# Patient Record
Sex: Male | Born: 1970 | Hispanic: No | Marital: Married | Smoking: Never smoker
Health system: Southern US, Community
[De-identification: ages and names within clinical notes are randomized; demographics above are authoritative.]

## PROBLEM LIST (undated history)

## (undated) DIAGNOSIS — I8390 Asymptomatic varicose veins of unspecified lower extremity: Secondary | ICD-10-CM

## (undated) HISTORY — PX: APPENDECTOMY: SHX54

## (undated) HISTORY — DX: Asymptomatic varicose veins of unspecified lower extremity: I83.90

---

## 2003-12-05 ENCOUNTER — Encounter: Admission: RE | Admit: 2003-12-05 | Discharge: 2003-12-05 | Payer: Self-pay | Admitting: Family Medicine

## 2004-06-29 ENCOUNTER — Inpatient Hospital Stay (HOSPITAL_COMMUNITY): Admission: EM | Admit: 2004-06-29 | Discharge: 2004-06-30 | Payer: Self-pay | Admitting: Emergency Medicine

## 2004-06-29 ENCOUNTER — Encounter (INDEPENDENT_AMBULATORY_CARE_PROVIDER_SITE_OTHER): Payer: Self-pay | Admitting: Specialist

## 2008-08-14 ENCOUNTER — Emergency Department (HOSPITAL_BASED_OUTPATIENT_CLINIC_OR_DEPARTMENT_OTHER): Admission: EM | Admit: 2008-08-14 | Discharge: 2008-08-14 | Payer: Self-pay | Admitting: Emergency Medicine

## 2008-08-14 ENCOUNTER — Ambulatory Visit: Payer: Self-pay | Admitting: Diagnostic Radiology

## 2008-12-11 ENCOUNTER — Encounter: Admission: RE | Admit: 2008-12-11 | Discharge: 2008-12-11 | Payer: Self-pay | Admitting: Unknown Physician Specialty

## 2010-08-09 NOTE — H&P (Signed)
NAMEJONLUKE, COBBINS NO.:  [id number]   MEDICAL RECORD NO.:  [id number]          PATIENT TYPE:  INP   LOCATION:  0104                         FACILITY:  [facility]   PHYSICIAN:  Anselm Pancoast. Weatherly, M.D.DATE OF BIRTH:  09/10/1970   DATE OF ADMISSION:  06/29/2004  DATE OF DISCHARGE:                                HISTORY & PHYSICAL   CHIEF COMPLAINT:  Abdominal pain.   HISTORY:  Matthew Macdonald is a 40 year old quite muscular male who presented to  the emergency room this afternoon after being seen in Urgent Care by Dr.  Cleta Alberts with the following history; said he felt okay yesterday but then during  the night was kind of bloated, cramping, bowels felt like he needed to have  a bowel movement but could not then in the morning started having more pain  and the pain kind of shifted to the right lower quadrant of the abdomen.  He  was seen by Dr. Cleta Alberts, they did a KUB that showed a kind of question of a  little calcification in the right lower quadrant, urinalysis was negative,  white count was elevated at approximately 16,000 and the patient was  definitely tender in the right lower quadrant of the abdomen.  Dr. Cleta Alberts  called me and I asked to see him in the emergency room and on examination he  definitely complains of tenderness predominantly in the right lower  quadrant, he is definitely more tender in the lower abdomen than the right  but he is definitely tender on the right and he is quite muscular.  He is  not tender on the left side and on review of the KUB the little area that  they question whether this could be possibly a kidney stone to me looks like  possibly it could be a fecalith but I think it is unlikely a kidney stone  and I talked with the radiologist, since he is not here and is at [facility] we actually got a repeat KUB that was read as unremarkable and the  little area that is on the repeat KUB comparing with the one from the Urgent  Care you can see that  the little area of calcification is a little  __________ actually within the sacroiliac joint.  I discussed with the  patient that clinically with this kind of epigastric pain then the pain  shifted to the right lower quadrant, mild temperature elevation and a marked  leukocytosis that clinically he has got acute appendicitis.  The patient had  a similar episode of pain about 6 months ago that never progressed with pain  this intense and he saw his doctor and they sort of thought it was a  gastroenteritis or whatever and suggested he take Pepcid.  He took Pepcid  this morning and it made no improvement in his pain.  He has got no past  history of any significant illnesses, he works as an Paramedic type work, he is  accompanied by several family members and he has had no previous surgery.  This morning he ate breakfast about 9:30  and he has been n.p.o. since then.   PHYSICAL EXAMINATION:  GENERAL:  He is a pleasant quite muscular young male  not really fat but weighs 208 pounds.  Temperature was 97, pulse 86,  respirations 16, blood pressure is 123/78.  EYES, EARS, NOSE AND THROAT:  Unremarkable, well hydrated, good breath  sounds.  CARDIAC:  Normal sinus rhythm.  ABDOMEN:  He has got kind of a slightly bloated abdomen.  He is definitely  tender on the right, predominantly right lower quadrant with muscle guarded  and rebound.  He is not tender on the left side.  He is slightly tender in  the right upper quadrant but it gets certainly more as you go lower.  RECTAL:  I did not do a rectal exam; it had been done at Urgent Care and was  described as unremarkable.  CNS:  Physiologic.  SKIN:  Unremarkable.   ADMISSION IMPRESSION:  Acute appendicitis.   PLAN:  We will plan an open appendectomy with general anesthesia.  He was  given 3 g of Unasyn and permission obtained for surgery.      WJW/MEDQ  D:  06/29/2004  T:  06/29/2004  Job:  [id number]

## 2010-08-09 NOTE — Op Note (Signed)
Matthew Macdonald, Matthew Macdonald NO.:  [id number]   MEDICAL RECORD NO.:  [id number]          PATIENT TYPE:  INP   LOCATION:  0104                         FACILITY:  [facility]   PHYSICIAN:  Anselm Pancoast. Weatherly, M.D.DATE OF BIRTH:  12/16/1970   DATE OF PROCEDURE:  06/29/2004  DATE OF DISCHARGE:                                 OPERATIVE REPORT   PREOPERATIVE DIAGNOSIS:  Acute appendicitis.   POSTOPERATIVE DIAGNOSIS:  Acute retrocecal appendicitis, nonruptured.   PROCEDURE:  Open appendectomy.   ANESTHESIA:  General.   SURGEON:  Anselm Pancoast. Zachery Dakins, M.D.   ASSISTANT:  Nurse.   INDICATIONS FOR PROCEDURE:  Matthew Macdonald is a 40 year old male who presented  to the emergency room with an approximately 24-hour history of progressive  pain, epigastric with a shift to the right lower quadrant.  He had a left  shift and an elevated white count.  A urinalysis was unremarkable.  A KUB at  the Urgent Care we first thought when seen was thought possibly to have a  calcification in the right lower quadrant.  This was repeated here and was  thought to be a little bony area in the pelvis and not a stone or fecalith.  The patient's tenderness is definitely right lower quadrant with muscle  guarding, and I recommended that we proceed on with an appendectomy.  The  patient had a similar episode of pain that subsided after about 24 hours  approximately six months ago.  He saw his doctor but no definite etiology  was ever determined.  He has not had a problem with diarrhea or kind of  chronic __________ abdominal pain.   DESCRIPTION OF PROCEDURE:  Preoperatively, he was given 3 g of Unasyn and  taken to the operating suite.  They had already placed him on PAS stockings.  No Foley was placed, and the abdomen was prepped with Betadine solution and  draped in a sterile manner.   A McBurney incision was made and the muscles split down through the skin,  Scarpa's fascia, external oblique.  The  external oblique was split in the  direction of its fibers.  He has a quite muscular rectus, and we opened just  lateral to this.  The little vessel right at the edge of the rectus was  divided, clamped, and ligated with 2-0 Vicryl.  The patient was somewhat  gaseous, and feeling down in the area you could feel kind of a thickening.  I could see his cecum and the terminal ileum, but I could not actually see  the appendix.  I enlarged the incision slightly so I could open up the  posterior peritoneal reflection.  The appendix was truly a retrocecal  appendix.  With mobilizing the cecum, I could then sort of peel this  inflamed appendix with a little short stubby appendix with the inflammation  in the distal two-thirds of the appendix, and the little mesentery vessels  were separated carefully and ligated with 2-0 Vicryl.  Then the base, which  was not inflamed, was crushed with a hemostat and ligated with 2-0 Vicryl.  The  appendix was removed.  A pursestring suture of 3-0 silk was placed, the  stump inverted, and the pursestring suture tied.  I placed an additional  stitch to kind of reinforce the closure, and then dropped the cecum, which I  never really had it elevated up on the skin level.  No evidence of any  bleeding.  Sponge count was correct.  I then closed the peritoneum and then  transversalis with a running 2-0 Vicryl.  The internal oblique was closed  with running 2-0 Vicryl and the external oblique with running 2-0 Vicryl.  Scarpa's fascia was closed with interrupted 4-0 Vicryl, 4-0 Vicryl two  subcuticular stitches, and 0.5-inch Steri-Strips and benzoin on the skin.   The patient tolerated the procedure nicely.  I am going to give him one  additional dose of antibiotics, and he should be able to tolerate a liquid  diet.  Whether he will go home tomorrow or Monday morning will be determined  on his clinical course.  The patient was awakened promptly and was resting  comfortably  in the recovery room after the completion of surgery.  Estimated  blood loss was minimal.  Sponge and needle counts were correct x2.      WJW/MEDQ  D:  06/29/2004  T:  06/30/2004  Job:  [id number]

## 2010-08-16 ENCOUNTER — Other Ambulatory Visit: Payer: Self-pay | Admitting: Internal Medicine

## 2010-08-16 ENCOUNTER — Ambulatory Visit
Admission: RE | Admit: 2010-08-16 | Discharge: 2010-08-16 | Disposition: A | Discharge status: Home / Self Care | Payer: BC Managed Care – PPO | Source: Ambulatory Visit | Attending: Internal Medicine | Admitting: Internal Medicine

## 2010-08-16 DIAGNOSIS — R52 Pain, unspecified: Secondary | ICD-10-CM

## 2010-11-24 ENCOUNTER — Emergency Department (HOSPITAL_COMMUNITY)
Admission: EM | Admit: 2010-11-24 | Discharge: 2010-11-24 | Disposition: A | Discharge status: Home / Self Care | Payer: BC Managed Care – PPO | Attending: Emergency Medicine | Admitting: Emergency Medicine

## 2010-11-24 ENCOUNTER — Emergency Department (HOSPITAL_COMMUNITY): Payer: BC Managed Care – PPO

## 2010-11-24 DIAGNOSIS — M25439 Effusion, unspecified wrist: Secondary | ICD-10-CM | POA: Insufficient documentation

## 2010-11-24 DIAGNOSIS — M25539 Pain in unspecified wrist: Secondary | ICD-10-CM | POA: Insufficient documentation

## 2010-11-24 DIAGNOSIS — M25569 Pain in unspecified knee: Secondary | ICD-10-CM | POA: Insufficient documentation

## 2010-11-24 DIAGNOSIS — S63509A Unspecified sprain of unspecified wrist, initial encounter: Secondary | ICD-10-CM | POA: Insufficient documentation

## 2010-11-24 DIAGNOSIS — IMO0002 Reserved for concepts with insufficient information to code with codable children: Secondary | ICD-10-CM | POA: Insufficient documentation

## 2010-11-24 DIAGNOSIS — S60219A Contusion of unspecified wrist, initial encounter: Secondary | ICD-10-CM | POA: Insufficient documentation

## 2011-07-03 ENCOUNTER — Ambulatory Visit
Admission: RE | Admit: 2011-07-03 | Discharge: 2011-07-03 | Disposition: A | Discharge status: Home / Self Care | Payer: BC Managed Care – PPO | Source: Ambulatory Visit | Attending: Internal Medicine | Admitting: Internal Medicine

## 2011-07-03 ENCOUNTER — Other Ambulatory Visit: Payer: Self-pay | Admitting: Internal Medicine

## 2011-07-03 DIAGNOSIS — R0602 Shortness of breath: Secondary | ICD-10-CM

## 2012-04-05 ENCOUNTER — Ambulatory Visit
Admission: RE | Admit: 2012-04-05 | Discharge: 2012-04-05 | Disposition: A | Discharge status: Home / Self Care | Payer: BC Managed Care – PPO | Source: Ambulatory Visit | Attending: Internal Medicine | Admitting: Internal Medicine

## 2012-04-05 ENCOUNTER — Other Ambulatory Visit: Payer: Self-pay | Admitting: Internal Medicine

## 2012-04-05 DIAGNOSIS — M25532 Pain in left wrist: Secondary | ICD-10-CM

## 2012-04-05 DIAGNOSIS — R0602 Shortness of breath: Secondary | ICD-10-CM

## 2012-12-27 ENCOUNTER — Other Ambulatory Visit: Payer: Self-pay | Admitting: Internal Medicine

## 2012-12-27 ENCOUNTER — Ambulatory Visit
Admission: RE | Admit: 2012-12-27 | Discharge: 2012-12-27 | Disposition: A | Discharge status: Home / Self Care | Payer: BC Managed Care – PPO | Source: Ambulatory Visit | Attending: Internal Medicine | Admitting: Internal Medicine

## 2012-12-27 DIAGNOSIS — R52 Pain, unspecified: Secondary | ICD-10-CM

## 2014-05-17 ENCOUNTER — Other Ambulatory Visit: Payer: Self-pay | Admitting: *Deleted

## 2014-05-17 ENCOUNTER — Encounter: Payer: Self-pay | Admitting: Vascular Surgery

## 2014-05-17 DIAGNOSIS — I8390 Asymptomatic varicose veins of unspecified lower extremity: Secondary | ICD-10-CM

## 2014-07-03 ENCOUNTER — Encounter: Payer: Self-pay | Admitting: Vascular Surgery

## 2014-07-04 ENCOUNTER — Ambulatory Visit (HOSPITAL_COMMUNITY)
Admission: RE | Admit: 2014-07-04 | Discharge: 2014-07-04 | Disposition: A | Discharge status: Home / Self Care | Payer: BLUE CROSS/BLUE SHIELD | Source: Ambulatory Visit | Attending: Vascular Surgery | Admitting: Vascular Surgery

## 2014-07-04 ENCOUNTER — Ambulatory Visit (INDEPENDENT_AMBULATORY_CARE_PROVIDER_SITE_OTHER): Payer: BLUE CROSS/BLUE SHIELD | Admitting: Vascular Surgery

## 2014-07-04 VITALS — BP 131/74 | HR 63 | Resp 16 | Ht 72.0 in | Wt 216.0 lb

## 2014-07-04 DIAGNOSIS — I868 Varicose veins of other specified sites: Secondary | ICD-10-CM

## 2014-07-04 DIAGNOSIS — I83893 Varicose veins of bilateral lower extremities with other complications: Secondary | ICD-10-CM

## 2014-07-04 DIAGNOSIS — I8390 Asymptomatic varicose veins of unspecified lower extremity: Secondary | ICD-10-CM

## 2014-07-04 NOTE — Progress Notes (Signed)
Referred by:  Raenette Rover [address] [place], [place] [address]  Reason for referral: Varicose veins  History of Present Illness  Matthew Macdonald is a 44 y.o. (1970-08-06) male who presents with chief complaint: varicose veins and "warmth" sensation to left foot and left thigh.   Patient notes, onset of symptoms 2 months ago. His symptoms initially manifested as a "warmth" feeling to his entire left leg. He was concerned for a blood clot and went to see his PCP, Dr. Ardelle Park. He was sent for bilateral lower extremity venous duplex which revealed reflux in the great saphenous veins bilaterally. He was started on thigh high 20-30 mm Hg compression stockings. He reports some improvement with the compression stockings, however his symptoms interfere with working on a daily basis.  The patient denies any pain or swelling. He has noticed that his right leg is larger than the left, but denies any symptoms in the right leg. He denies any prior history of DVT. He does have some skin changes that are itchy to his distal thighs bilaterally which he was told may be psoriasis or possibly related to his varicose veins.  He denies any history of nonhealing wounds.   He has no significant past medical history.   Past Medical History  Diagnosis Date  . Varicose veins     No past surgical history on file.  History   Social History  . Marital Status: Married    Spouse Name: N/A  . Number of Children: N/A  . Years of Education: N/A   Occupational History  . Not on file.   Social History Main Topics  . Smoking status: Not on file  . Smokeless tobacco: Not on file  . Alcohol Use: Not on file  . Drug Use: Not on file  . Sexual Activity: Not on file   Other Topics Concern  . Not on file   Social History Narrative  . No narrative on file    No family history on file.   No current outpatient prescriptions on file prior to visit.   No current facility-administered medications on file  prior to visit.    No Known Allergies   REVIEW OF SYSTEMS:  (Positives checked otherwise negative)  CARDIOVASCULAR:   chest pain,  chest pressure,  palpitations,  shortness of breath when laying flat,  shortness of breath with exertion,   pain in feet when walking,  pain in feet when laying flat,  history of blood clot in veins (DVT),  history of phlebitis,  swelling in legs,  varicose veins  PULMONARY:   productive cough,  asthma,  wheezing  NEUROLOGIC:   weakness in arms or legs,  numbness in arms or legs,  difficulty speaking or slurred speech,  temporary loss of vision in one eye,  dizziness  HEMATOLOGIC:   bleeding problems,  problems with blood clotting too easily  MUSCULOSKEL:   joint pain,  joint swelling  GASTROINTEST:   vomiting blood,  blood in stool     GENITOURINARY:   burning with urination,  blood in urine  PSYCHIATRIC:   history of major depression  INTEGUMENTARY:   rashes,  ulcers  CONSTITUTIONAL:   fever,  chills   Physical Examination Filed Vitals:   07/04/14 1353  BP: 131/74  Pulse: 63  Resp: 16  Height: 6' (1.829 m)  Weight: 216 lb (97.977 kg)   Body mass index is 29.29 kg/(m^2).  General: A&O x 3, WDWN in no acute distress  Head: Hernandez/AT  Neck: Supple, no nuchal rigidity  Pulmonary: Sym exp, good air movt, CTAB, no rales, rhonchi, & wheezing  Cardiac: RRR, Nl S1, S2, no Murmurs, rubs or gallops  Vascular: palpable dorsalis pedis pulses bilaterally   Musculoskeletal: Right lower leg swollen. Right calf 3 cm larger than left with swelling. Varicosities seen from right pre-tibial area to distal thigh.  Left leg without swelling and without visible varicosities. Extremities without ischemic changes  Neurologic: CN 2-12 grossly intact Pain and light touch intact in extremities  Psychiatric: Judgment intact, Mood & affect appropriate for pt's clinical situation  Dermatologic: dry  scaly rash at medial aspect of distal thighs bilaterally    Non-Invasive Vascular Imaging  BLE Venous Insufficiency Duplex (Date: 07/04/2014):   RLE: negative for DVT, positive GSV reflux. GSV :0.50 cm at knee progressively increases proximally to 0.81 cm at SFJ  LLE: negative for DVT, positive GSV reflux . GSV: 0.43 cm at knee progressively increases proximally to 0.67 cm at [facility]  Outside Studies/Documentation 2 pages of outside documents were reviewed including: venous duplex from [facility].  Medical Decision Making  Matthew Macdonald is a 44 y.o. male who presents with: bilateral lower extremity venous insufficiency, right greater than left. He has both superficial and deep venous reflux, without evidence of DVT. He describes feelings of warmth in his thigh and foot but denies any symptoms in his right leg. On physical exam, his right calf is objectively larger and measures 3 cm larger than his left calf with obvious swelling. There are large varicosities seen from his left medial thigh to the pre-tibial area. There are no visible varicosities on his left side or any swelling. He does have some skin changes seen in the distal thigh bilaterally that may be related to his varicosities.   On duplex exam the right great saphenous vein measures 0.50 cm at the knee and gradually increases to 0.81 cm at the saphenofemoral junction. On the left, the great saphenous vein measures 0.43 cm at the knee and progressively increases proximally to 0.67 cm at the saphenofemoral junction.   It was recommended that the patient consider having a procedure in his right leg due to having a  large dilated GSV vein with significant swelling and possible skin changes. Explained to the patient that with continued swelling, he may develop future issues with pain and progression of skin changes and ulcer development.   The patient is currently wearing thigh high 20-30 mm Hg compression stockings. He has had  them for two months but still reports issues with warmth and discomfort to his left leg. This interferes with his work and daily activities.  Explained that he will need to complete a three month trial of compression therapy prior to being considered for any venous procedure.The patient is not currently wanting to have any procedures but wants to return in three months and decide at that time. He will follow up in three months with Dr. Hart RochesterLawson.   Maris BergerKimberly Trinh, PA-C [facility] Office: [phone] Pager: [phone]  07/04/2014, 2:27 PM  This patient was seen and examined in conjunction with Dr. Hart RochesterLawson Patient has severe reflux in right great saphenous system with large great saphenous vein resulting in considerable edema in distal thigh and calf area with 3 cm discrepancy right greater than left. Patient has large bulging varicosities in distal thigh and medial calf area right leg.  Will try patient on long leg elastic compression stockings, elevation, and  ibuprofen and see him back in 3 months. I think he would benefit from laser ablation right great saphenous vein with multiple stab phlebectomy if not dramatically improved from a swelling standpoint over the next 3 months to protect his skin over the long-term and relieve the swelling

## 2014-10-06 ENCOUNTER — Encounter: Payer: Self-pay | Admitting: Vascular Surgery

## 2014-10-10 ENCOUNTER — Ambulatory Visit: Payer: BLUE CROSS/BLUE SHIELD | Admitting: Vascular Surgery

## 2015-01-21 IMAGING — CR DG ANKLE COMPLETE 3+V*L*
3 series · 3 of 3 positions shown · non-contrast
Comparison: None.

CLINICAL DATA: Basketball injury 2 weeks ago. Pain across left
ankle.

EXAM:
LEFT ANKLE COMPLETE - 3+ VIEW

[view not recorded (1 of 3)]
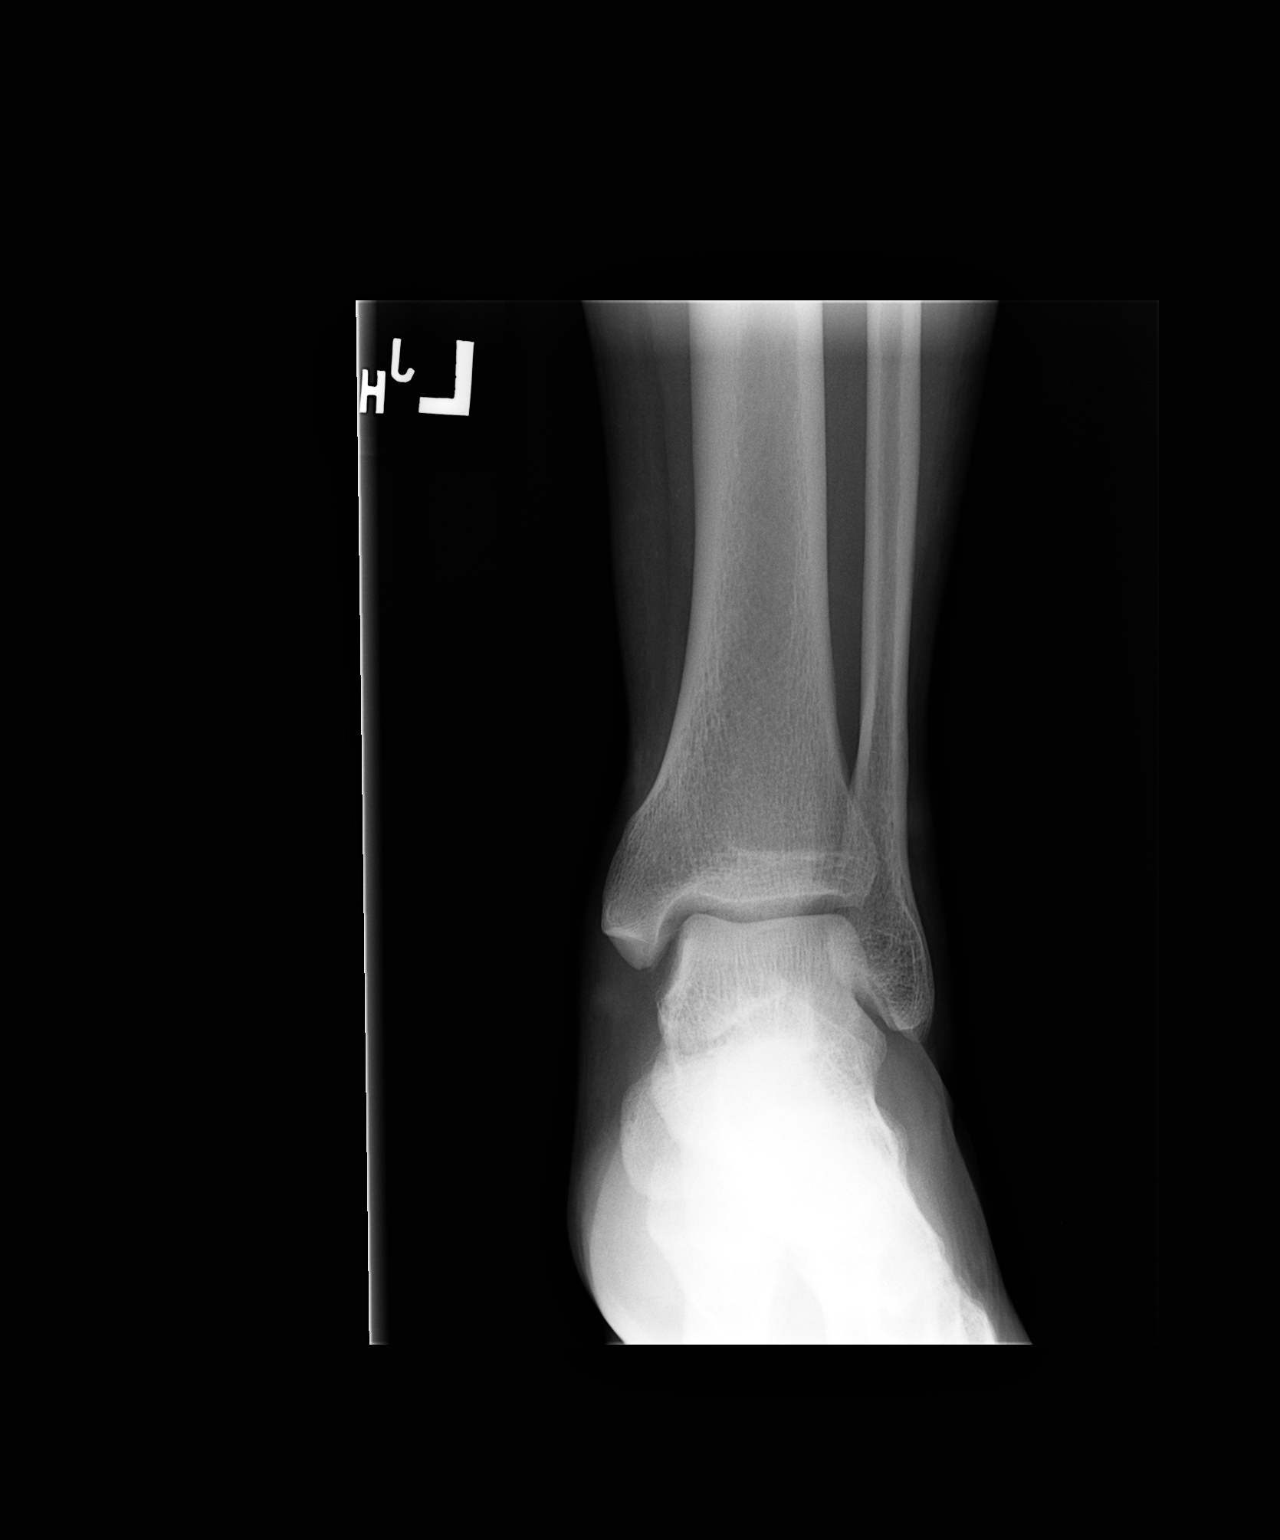

[view not recorded (2 of 3)]
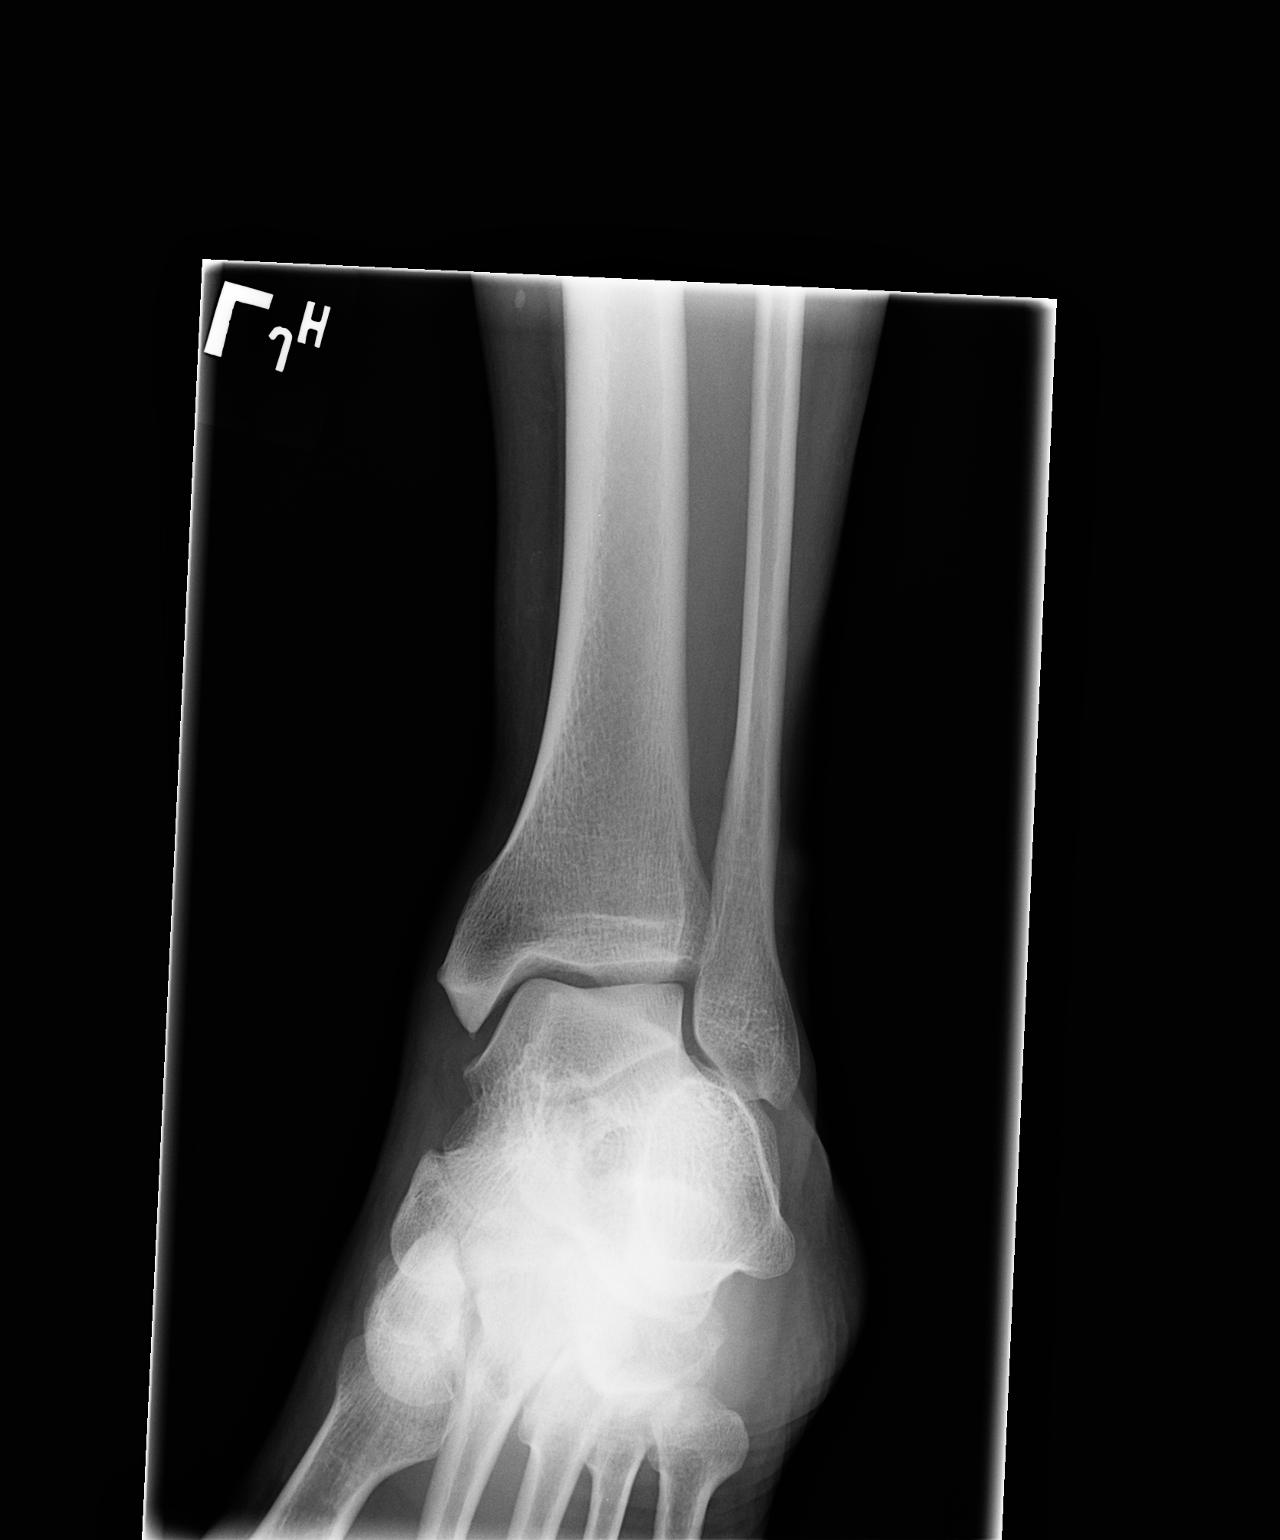

[view not recorded (3 of 3)]
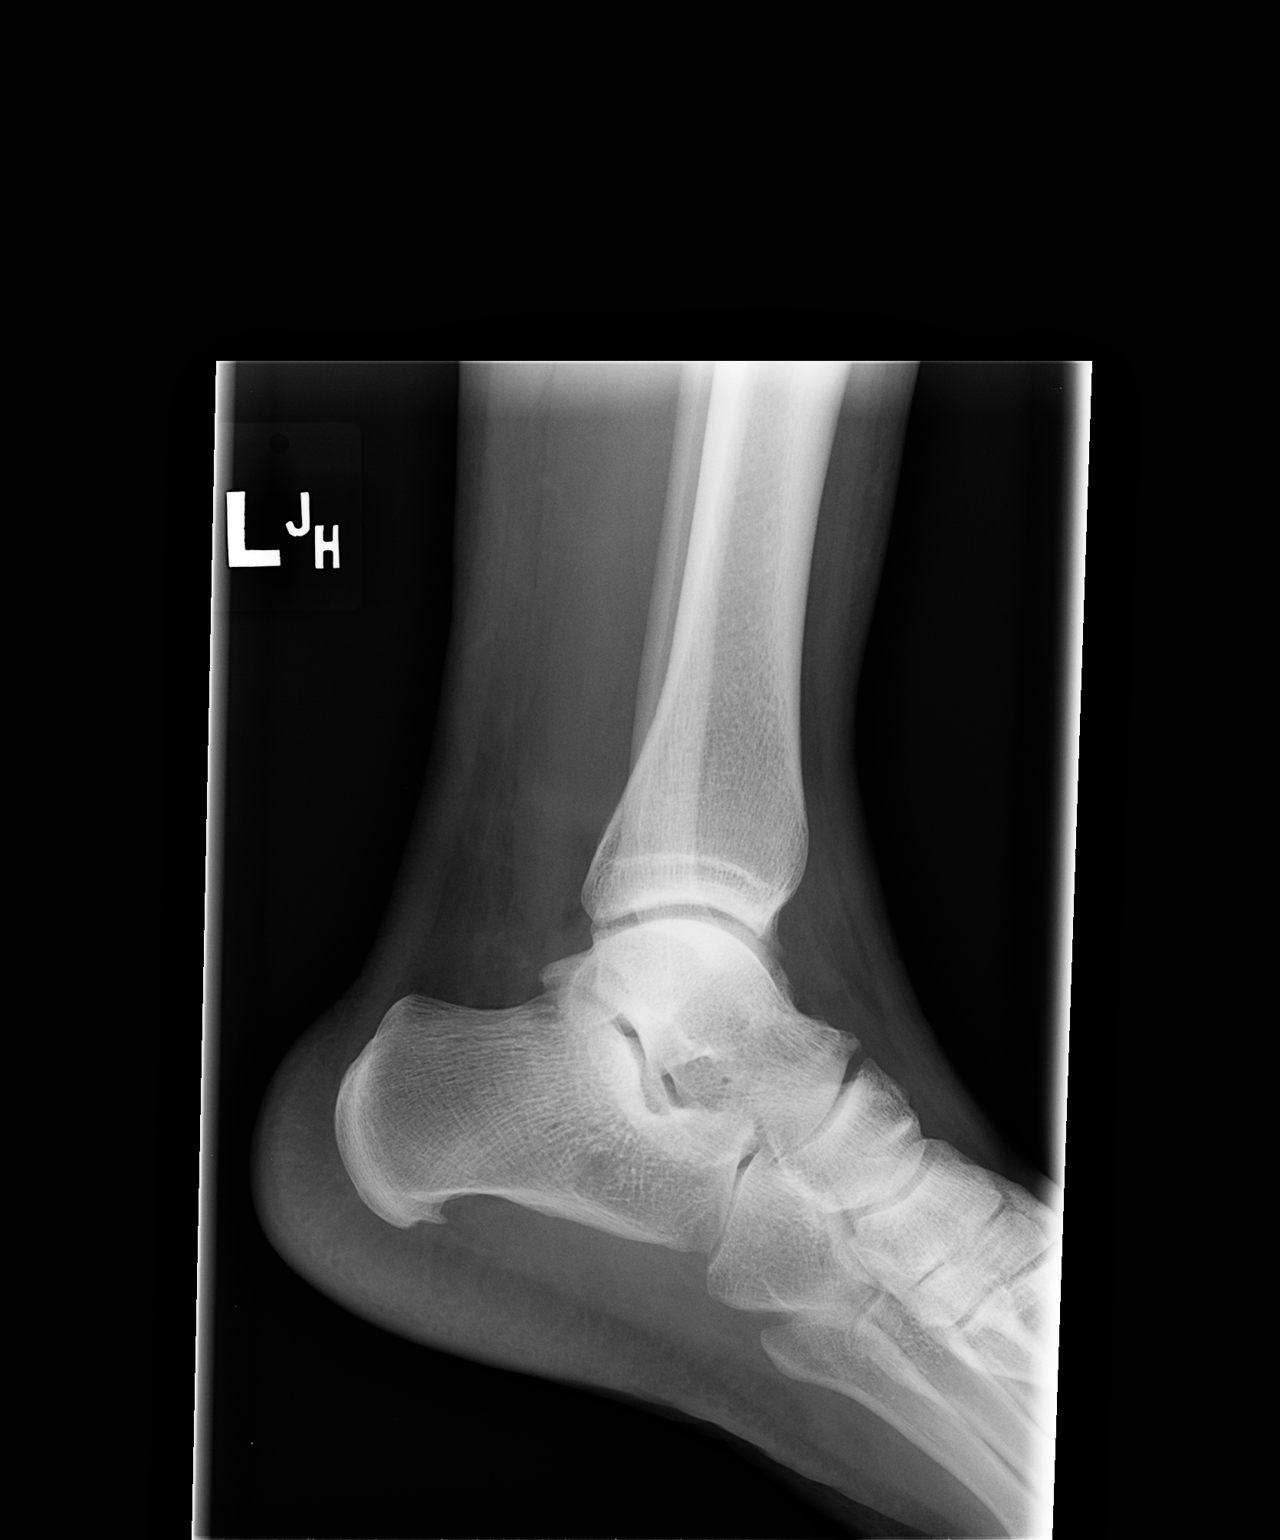

[3 of 3 positions shown; findings below may reference images not displayed]

FINDINGS: There is no evidence of fracture, dislocation, or joint effusion.
There is no evidence of arthropathy. No bone lesion. Small plantar
calcaneal spur. The soft tissues are unremarkable.
IMPRESSION: No fracture or ankle joint abnormality.

## 2017-03-12 ENCOUNTER — Other Ambulatory Visit: Payer: Self-pay | Admitting: Gastroenterology

## 2017-03-12 DIAGNOSIS — R1031 Right lower quadrant pain: Secondary | ICD-10-CM

## 2017-07-28 ENCOUNTER — Other Ambulatory Visit: Payer: Self-pay | Admitting: Orthopedic Surgery

## 2017-07-28 DIAGNOSIS — M25562 Pain in left knee: Secondary | ICD-10-CM

## 2017-08-01 ENCOUNTER — Ambulatory Visit
Admission: RE | Admit: 2017-08-01 | Discharge: 2017-08-01 | Disposition: A | Discharge status: Home / Self Care | Payer: BLUE CROSS/BLUE SHIELD | Source: Ambulatory Visit | Attending: Orthopedic Surgery | Admitting: Orthopedic Surgery

## 2017-08-01 DIAGNOSIS — M25562 Pain in left knee: Secondary | ICD-10-CM

## 2021-01-02 ENCOUNTER — Ambulatory Visit (AMBULATORY_SURGERY_CENTER): Payer: 59

## 2021-01-02 ENCOUNTER — Encounter: Payer: Self-pay | Admitting: Internal Medicine

## 2021-01-02 VITALS — Ht 72.0 in | Wt 225.0 lb

## 2021-01-02 DIAGNOSIS — Z1211 Encounter for screening for malignant neoplasm of colon: Secondary | ICD-10-CM

## 2021-01-02 MED ORDER — NA SULFATE-K SULFATE-MG SULF 17.5-3.13-1.6 GM/177ML PO SOLN: 1.0000 | ORAL | 0 refills | Status: AC

## 2021-01-02 NOTE — Progress Notes (Signed)
No egg or soy allergy known to patient  No issues with past sedation with any surgeries or procedures Patient denies ever being told they had issues or difficulty with intubation  No FH of Malignant Hyperthermia No diet pills per patient No home 02 use per patient  No blood thinners per patient  Pt denies issues with constipation  No A fib or A flutter  COVID 19 guidelines implemented in [place] today with Pt and CMA Pt is fully vaccinated  for Covid      Due to the COVID-19 pandemic we are asking patients to follow certain guidelines.  Pt aware of COVID protocols and [facility] guidelines   

## 2021-01-14 ENCOUNTER — Telehealth: Payer: Self-pay | Admitting: Internal Medicine

## 2021-01-14 ENCOUNTER — Encounter: Payer: BLUE CROSS/BLUE SHIELD | Admitting: Internal Medicine

## 2021-01-14 NOTE — Telephone Encounter (Signed)
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  He called back stating he was waiting to hear about what his insurance would cover.  We sent him a letter on the 12th of October explaining his insurance details.  He stated  that his address was incorrect so he never received his insurance explanation.  I did change his address and he said he was not prepared to have colon or pay his deductible  at this time. He said he would call next year sometime to reschedule.
